# Patient Record
Sex: Male | Born: 1975 | Race: White | Hispanic: No | Marital: Single | Smoking: Current every day smoker
Health system: Southern US, Community
[De-identification: ages and names within clinical notes are randomized; demographics above are authoritative.]

## PROBLEM LIST (undated history)

## (undated) HISTORY — PX: DENTAL SURGERY: SHX609

---

## 2004-11-08 ENCOUNTER — Emergency Department: Payer: Self-pay | Admitting: Emergency Medicine

## 2006-08-08 ENCOUNTER — Ambulatory Visit: Payer: Self-pay | Admitting: Internal Medicine

## 2007-08-12 ENCOUNTER — Encounter
Admission: RE | Admit: 2007-08-12 | Discharge: 2007-08-12 | Payer: Self-pay | Admitting: Physical Medicine & Rehabilitation

## 2009-04-27 ENCOUNTER — Ambulatory Visit: Payer: Self-pay | Admitting: Family Medicine

## 2011-07-28 ENCOUNTER — Emergency Department: Payer: Self-pay | Admitting: Emergency Medicine

## 2011-07-28 LAB — CBC
HCT: 44.2 % (ref 40.0–52.0)
HGB: 15.1 g/dL (ref 13.0–18.0)
MCH: 30.4 pg (ref 26.0–34.0)
MCV: 89 fL (ref 80–100)
RBC: 4.97 10*6/uL (ref 4.40–5.90)
RDW: 12.5 % (ref 11.5–14.5)
WBC: 11.1 10*3/uL — ABNORMAL HIGH (ref 3.8–10.6)

## 2011-07-28 LAB — COMPREHENSIVE METABOLIC PANEL
Anion Gap: 5 — ABNORMAL LOW (ref 7–16)
Calcium, Total: 9.2 mg/dL (ref 8.5–10.1)
EGFR (African American): >60
EGFR (Non-African Amer.): >60
Glucose: 97 mg/dL (ref 65–99)
Potassium: 4.1 mmol/L (ref 3.5–5.1)
SGOT(AST): 58 U/L — ABNORMAL HIGH (ref 15–37)
Total Protein: 8.1 g/dL (ref 6.4–8.2)

## 2011-07-28 LAB — LIPASE, BLOOD: Lipase: 102 U/L (ref 73–393)

## 2013-09-26 ENCOUNTER — Ambulatory Visit: Payer: Self-pay | Admitting: Physician Assistant
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
## 2015-10-08 ENCOUNTER — Encounter: Payer: Self-pay | Admitting: Emergency Medicine

## 2015-10-08 ENCOUNTER — Ambulatory Visit
Admission: EM | Admit: 2015-10-08 | Discharge: 2015-10-08 | Disposition: A | Discharge status: Home / Self Care | Payer: 59

## 2015-10-08 DIAGNOSIS — R519 Headache, unspecified: Secondary | ICD-10-CM

## 2015-10-08 DIAGNOSIS — R51 Headache: Secondary | ICD-10-CM

## 2015-10-08 MED ORDER — DICLOFENAC POTASSIUM 50 MG PO TABS: 50.0000 mg | ORAL_TABLET | Freq: Three times a day (TID) | ORAL | 0 refills | Status: AC

## 2015-10-08 MED ORDER — TRAMADOL HCL 50 MG PO TABS: 50.0000 mg | ORAL_TABLET | Freq: Four times a day (QID) | ORAL | 0 refills | Status: AC | PRN

## 2015-10-08 NOTE — ED Triage Notes (Signed)
Patient c/o pain on the left side of his head that started on Monday.  Patient denies N/V.  Patient denies fevers.

## 2015-10-08 NOTE — ED Provider Notes (Signed)
CSN: [id number]     Arrival date & time 10/08/15  1236 History   First MD Initiated Contact with Patient 10/08/15 1303     Chief Complaint  Patient presents with  . Headache   (Consider location/radiation/quality/duration/timing/severity/associated sxs/prior Treatment) HPI History obtained from patient:  Pt presents with the cc of:  Headache Duration of symptoms: Since Monday Treatment prior to arrival: Tylenol ibuprofen Context: Onset of headache not described as the worse headache of his life but has lasted for several days. No vision changes. Other symptoms include: None Pain score: 4 FAMILY HISTORY: Hypertension    History reviewed. No pertinent past medical history. Past Surgical History:  Procedure Laterality Date  . DENTAL SURGERY     History reviewed. No pertinent family history. Social History  Substance Use Topics  . Smoking status: Current Every Day Smoker    Packs/day: 1.00    Types: Cigarettes  . Smokeless tobacco: Never Used  . Alcohol use Yes    Review of Systems  Denies: HEADACHE, NAUSEA, ABDOMINAL PAIN, CHEST PAIN, CONGESTION, DYSURIA, SHORTNESS OF BREATH  Allergies  Review of patient's allergies indicates no known allergies.  Home Medications   Prior to Admission medications   Medication Sig Start Date End Date Taking? Authorizing Provider  oxyCODONE-acetaminophen (PERCOCET) 10-325 MG tablet Take 1 tablet by mouth every 6 (six) hours as needed for pain.   Yes Historical Provider, MD   Meds Ordered and Administered this Visit  Medications - No data to display  BP 125/82 (BP Location: Right Arm)   Pulse 68   Temp 97.2 F (36.2 C) (Tympanic)   Resp 16   Ht 6\' 1"  (1.854 m)   Wt 248 lb (112.5 kg)   SpO2 99%   BMI 32.72 kg/m  No data found.   Physical Exam NURSES NOTES AND VITAL SIGNS REVIEWED. CONSTITUTIONAL: Well developed, well nourished, no acute distress HEENT: normocephalic, atraumatic EYES: Conjunctiva normal NECK:normal ROM,  supple, no adenopathy PULMONARY:No respiratory distress, normal effort ABDOMINAL: Soft, ND, NT BS+, No CVAT MUSCULOSKELETAL: Normal ROM of all extremities,  SKIN: warm and dry without rash PSYCHIATRIC: Mood and affect, behavior are normal NEUROLOGICAL SCREENING EXAM: Constitutional:  oriented to person, place, and time.  Neurological:  .  normal strength and normal reflexes. No cranial nerve deficit or sensory deficit. negative Romberg sign. GCS eye subscore is 4. GCS verbal subscore is 5. GCS motor subscore is 6.    Urgent Care Course   Clinical Course   NORMAL NEURO EXAM, HISTORY IS NOT WORRYING. NO EMERGENT NEED FOR ADVANCED IMAGING.  Procedures (including critical care time)  Labs Review Labs Reviewed - No data to display  Imaging Review No results found.   Visual Acuity Review  Right Eye Distance:   Left Eye Distance:   Bilateral Distance:    Right Eye Near:   Left Eye Near:    Bilateral Near:        RX TRAMADOL/DICLOFENAC.  MDM   1. Bad headache     Patient is reassured that there are no issues that require transfer to higher level of care at this time or additional tests. Patient is advised to continue home symptomatic treatment. Patient is advised that if there are new or worsening symptoms to attend the emergency department, contact primary care provider, or return to UC. Instructions of care provided discharged home in stable condition.    THIS NOTE WAS GENERATED USING A VOICE RECOGNITION SOFTWARE PROGRAM. ALL REASONABLE EFFORTS  WERE MADE TO PROOFREAD THIS  DOCUMENT FOR ACCURACY.  I have verbally reviewed the discharge instructions with the patient. A printed AVS was given to the patient.  All questions were answered prior to discharge.      Tharon AquasFrank C Montel Vanderhoof, PA 10/08/15 [phone]

## 2022-08-19 ENCOUNTER — Other Ambulatory Visit: Payer: Self-pay

## 2022-08-19 ENCOUNTER — Emergency Department
Admission: EM | Admit: 2022-08-19 | Discharge: 2022-08-19 | Disposition: A | Discharge status: Home / Self Care | Payer: 59 | Attending: Emergency Medicine | Admitting: Emergency Medicine

## 2022-08-19 DIAGNOSIS — T782XXA Anaphylactic shock, unspecified, initial encounter: Secondary | ICD-10-CM

## 2022-08-19 MED ORDER — DIPHENHYDRAMINE HCL 50 MG/ML IJ SOLN
25.0000 mg | Freq: Once | INTRAMUSCULAR | Status: AC
  Administered 2022-08-19: 25 mg via INTRAVENOUS
  Filled 2022-08-19: qty 1

## 2022-08-19 MED ORDER — METHYLPREDNISOLONE SODIUM SUCC 125 MG IJ SOLR
125.0000 mg | Freq: Once | INTRAMUSCULAR | Status: AC
  Administered 2022-08-19: 125 mg via INTRAVENOUS
  Filled 2022-08-19: qty 2

## 2022-08-19 MED ORDER — PREDNISONE 50 MG PO TABS: 50.0000 mg | ORAL_TABLET | Freq: Every day | ORAL | 0 refills | Status: AC

## 2022-08-19 MED ORDER — ONDANSETRON HCL 4 MG/2ML IJ SOLN
4.0000 mg | Freq: Once | INTRAMUSCULAR | Status: AC
  Administered 2022-08-19: 4 mg via INTRAVENOUS
  Filled 2022-08-19: qty 2

## 2022-08-19 MED ORDER — SODIUM CHLORIDE 0.9 % IV BOLUS
1000.0000 mL | Freq: Once | INTRAVENOUS | Status: AC
  Administered 2022-08-19: 1000 mL via INTRAVENOUS

## 2022-08-19 MED ORDER — FAMOTIDINE IN NACL 20-0.9 MG/50ML-% IV SOLN
20.0000 mg | Freq: Once | INTRAVENOUS | Status: AC
  Administered 2022-08-19: 20 mg via INTRAVENOUS
  Filled 2022-08-19: qty 50

## 2022-08-19 MED ORDER — EPINEPHRINE 0.3 MG/0.3ML IJ SOAJ
0.3000 mg | Freq: Once | INTRAMUSCULAR | Status: AC
  Administered 2022-08-19: 0.3 mg via INTRAMUSCULAR
  Filled 2022-08-19: qty 0.3

## 2022-08-19 MED ORDER — HYDROXYZINE HCL 25 MG PO TABS: 25.0000 mg | ORAL_TABLET | Freq: Three times a day (TID) | ORAL | 0 refills | Status: AC | PRN

## 2022-08-19 MED ORDER — EPINEPHRINE 0.3 MG/0.3ML IJ SOAJ: 0.3000 mg | INTRAMUSCULAR | 2 refills | Status: AC | PRN

## 2022-08-19 NOTE — ED Provider Notes (Signed)
[facility] Provider Note    Event Date/Time   First MD Initiated Contact with Patient 08/19/22 [phone]     (approximate)   History   Insect Bite   HPI  Douglas Horton is a 47 y.o. male no significant past medical history presents to the emergency department after being stung by a wasp.  States that he was stung in his right foot, lower leg and back.  Recontour rash with itching.  Felt very lightheaded and dizzy and his wife made him come into the emergency department.  Denies any shortness of breath at this time.  Denies any throat closing.  No nausea, vomiting or diarrhea.  No prior episodes of anaphylactic reaction.     Physical Exam   Triage Vital Signs: ED Triage Vitals  Encounter Vitals Group     BP 08/19/22 0739 (!) 71/55     Systolic BP Percentile --      Diastolic BP Percentile --      Pulse Rate 08/19/22 0739 (!) 117     Resp 08/19/22 0739 20     Temp 08/19/22 0739 97.7 F (36.5 C)     Temp Source 08/19/22 0739 Oral     SpO2 08/19/22 0739 93 %     Weight 08/19/22 0738 230 lb (104.3 kg)     Height 08/19/22 0738 6\' 1"  (1.854 m)     Head Circumference --      Peak Flow --      Pain Score 08/19/22 0738 0     Pain Loc --      Pain Education --      Exclude from Growth Chart --     Most recent vital signs: Vitals:   08/19/22 0840 08/19/22 0850  BP: 123/76 117/75  Pulse: (!) 56 61  Resp: 19 19  Temp:    SpO2: 96% 95%    Physical Exam Constitutional:      Appearance: He is well-developed.  HENT:     Head: Atraumatic.  Eyes:     Conjunctiva/sclera: Conjunctivae normal.  Cardiovascular:     Rate and Rhythm: Regular rhythm.  Pulmonary:     Effort: No respiratory distress.  Musculoskeletal:     Cervical back: Normal range of motion.  Skin:    General: Skin is warm.     Findings: Rash present.     Comments: Diffuse urticaria to bilateral lower extremities, trunk and right upper extremity  Neurological:     Mental Status: He  is alert. Mental status is at baseline.     IMPRESSION / MDM / ASSESSMENT AND PLAN / ED COURSE  I reviewed the triage vital signs and the nursing notes.  Clinical picture is most concerning for anaphylactic reaction.  On arrival patient tachycardic and hypotensive with a blood pressure of 71/55 and tachycardic at 117.  Given his hypotension and rash concern for anaphylactic reaction from bee sting.  No signs of a retained stinger on exam.  EKG  I, Corena Herter, the attending physician, personally viewed and interpreted this ECG.   Rate: 100s  Rhythm: Sinus tachycardia  Axis: Normal  Intervals: Normal  ST&T Change: None  No tachycardic or bradycardic dysrhythmias while on cardiac telemetry.  LABS (all labs ordered are listed, but only abnormal results are displayed) Labs interpreted as -    Labs Reviewed - No data to display   MDM  Treated with IM epi, Solu-Medrol, Benadryl, famotidine and given 1 L of IV fluids  On reevaluation blood pressure improved, symptoms resolved.  Discussed at length needing to follow-up with her primary care physician.  He expressed understanding but stated that he likely will not follow-up.  Given resources for primary care provider.  Given a prescription and discussed when to use an EpiPen.  Given return precautions for any worsening symptoms.   PROCEDURES:  Critical Care performed: yes  .Critical Care  Performed by: Corena Herter, MD Authorized by: Corena Herter, MD   Critical care provider statement:    Critical care time (minutes):  30   Critical care time was exclusive of:  Separately billable procedures and treating other patients   Critical care was necessary to treat or prevent imminent or life-threatening deterioration of the following conditions:  Shock   Critical care was time spent personally by me on the following activities:  Development of treatment plan with patient or surrogate, discussions with consultants,  evaluation of patient's response to treatment, examination of patient, ordering and review of laboratory studies, ordering and review of radiographic studies, ordering and performing treatments and interventions, pulse oximetry, re-evaluation of patient's condition and review of old charts   Patient's presentation is most consistent with acute presentation with potential threat to life or bodily function.   MEDICATIONS ORDERED IN ED: Medications  diphenhydrAMINE (BENADRYL) injection 25 mg (25 mg Intravenous Given 08/19/22 0759)  EPINEPHrine (EPI-PEN) injection 0.3 mg (0.3 mg Intramuscular Given 08/19/22 0800)  ondansetron (ZOFRAN) injection 4 mg (4 mg Intravenous Given 08/19/22 0800)  methylPREDNISolone sodium succinate (SOLU-MEDROL) 125 mg/2 mL injection 125 mg (125 mg Intravenous Given 08/19/22 0800)  famotidine (PEPCID) IVPB 20 mg premix (0 mg Intravenous Stopped 08/19/22 0844)  sodium chloride 0.9 % bolus 1,000 mL (0 mLs Intravenous Stopped 08/19/22 0854)    FINAL CLINICAL IMPRESSION(S) / ED DIAGNOSES   Final diagnoses:  Anaphylactic shock     Rx / DC Orders   ED Discharge Orders          Ordered    Ambulatory Referral to Primary Care (Establish Care)        08/19/22 0814    EPINEPHrine 0.3 mg/0.3 mL IJ SOAJ injection  As needed        08/19/22 0816    predniSONE (DELTASONE) 50 MG tablet  Daily with breakfast        08/19/22 0816    hydrOXYzine (ATARAX) 25 MG tablet  3 times daily PRN        08/19/22 0816             Note:  This document was prepared using Dragon voice recognition software and may include unintentional dictation errors.   Corena Herter, MD 08/19/22 1109

## 2022-08-19 NOTE — ED Notes (Signed)
Pt verbalizes understanding of discharge instructions. Opportunity for questioning and answers were provided. Pt discharged from ED to home with wife.    

## 2022-08-19 NOTE — ED Triage Notes (Signed)
Pt reports he was stung by 6 yellow jackets this am and then started feeling dizzy and having hives over his arms and abd. Pt reports has never had a reaction before like this to a sting. Denies airway issues or feeling SOB. BP 71/55, recheck 88/66.
